# Patient Record
Sex: Male | Born: 1960 | Race: White | Hispanic: No | Marital: Married | Smoking: Never smoker
Health system: Southern US, Community
[De-identification: ages and names within clinical notes are randomized; demographics above are authoritative.]

## PROBLEM LIST (undated history)

## (undated) DIAGNOSIS — M1990 Unspecified osteoarthritis, unspecified site: Secondary | ICD-10-CM

## (undated) DIAGNOSIS — T8859XA Other complications of anesthesia, initial encounter: Secondary | ICD-10-CM

## (undated) DIAGNOSIS — T7840XA Allergy, unspecified, initial encounter: Secondary | ICD-10-CM

## (undated) DIAGNOSIS — T4145XA Adverse effect of unspecified anesthetic, initial encounter: Secondary | ICD-10-CM

## (undated) DIAGNOSIS — I10 Essential (primary) hypertension: Secondary | ICD-10-CM

## (undated) DIAGNOSIS — R42 Dizziness and giddiness: Secondary | ICD-10-CM

## (undated) HISTORY — DX: Essential (primary) hypertension: I10

## (undated) HISTORY — PX: APPENDECTOMY: SHX54

## (undated) HISTORY — DX: Allergy, unspecified, initial encounter: T78.40XA

## (undated) HISTORY — PX: ELBOW SURGERY: SHX618

## (undated) HISTORY — PX: HERNIA REPAIR: SHX51

## (undated) HISTORY — PX: JOINT REPLACEMENT: SHX530

---

## 2006-10-28 DIAGNOSIS — C4491 Basal cell carcinoma of skin, unspecified: Secondary | ICD-10-CM

## 2006-10-28 HISTORY — DX: Basal cell carcinoma of skin, unspecified: C44.91

## 2013-10-04 ENCOUNTER — Encounter: Payer: Self-pay | Admitting: *Deleted

## 2013-10-24 ENCOUNTER — Ambulatory Visit (INDEPENDENT_AMBULATORY_CARE_PROVIDER_SITE_OTHER): Payer: Self-pay | Admitting: General Surgery

## 2013-10-24 ENCOUNTER — Encounter: Payer: Self-pay | Admitting: General Surgery

## 2013-10-24 VITALS — BP 140/90 | HR 84 | Resp 12 | Ht 72.0 in | Wt 199.0 lb

## 2013-10-24 DIAGNOSIS — Z1211 Encounter for screening for malignant neoplasm of colon: Secondary | ICD-10-CM

## 2013-10-24 MED ORDER — POLYETHYLENE GLYCOL 3350 17 GM/SCOOP PO POWD: ORAL | Status: AC

## 2013-10-24 NOTE — Progress Notes (Signed)
Patient ID: Christian Hodges, male   DOB: 13-Feb-1961, 53 y.o.   MRN: [id number]  Chief Complaint  Patient presents with  . Other    colonoscopy    HPI Christian Hodges is a 53 y.o. male here today for a evaluation screening colonoscopy. Patient states GI problems at this time. No family history of colon problems. No prior colonoscopies.   HPI  Past Medical History  Diagnosis Date  . Hypertension   . Allergy     Past Surgical History  Procedure Laterality Date  . Joint replacement Bilateral     hip  . Appendectomy    . Hernia repair    . Elbow surgery Right     History reviewed. No pertinent family history.  Social History History  Substance Use Topics  . Smoking status: Never Smoker   . Smokeless tobacco: Never Used  . Alcohol Use: No    No Known Allergies  Current Outpatient Prescriptions  Medication Sig Dispense Refill  . lisinopril (PRINIVIL,ZESTRIL) 20 MG tablet Take 20 mg by mouth daily.      Marland Kitchen loratadine (CLARITIN) 10 MG tablet Take 10 mg by mouth daily.      . polyethylene glycol powder (GLYCOLAX/MIRALAX) powder 255 grams one bottle for colonoscopy prep  255 g  0   No current facility-administered medications for this visit.    Review of Systems Review of Systems  Constitutional: Negative.   Respiratory: Negative.   Cardiovascular: Negative.   Gastrointestinal: Negative.     Blood pressure 140/90, pulse 84, resp. rate 12, height 6' (1.829 m), weight 199 lb (90.266 kg).  Physical Exam Physical Exam  Constitutional: He appears well-developed and well-nourished.  Eyes: Conjunctivae are normal. No scleral icterus.  Neck: Neck supple. No thyromegaly present.  Cardiovascular: Normal rate, regular rhythm and normal heart sounds.   No murmur heard. Pulmonary/Chest: Effort normal and breath sounds normal.  Abdominal: Soft. Bowel sounds are normal. There is no hepatosplenomegaly. There is no tenderness. A hernia (small umbilical hernia present.) is present.   Lymphadenopathy:    He has no cervical adenopathy.    Data Reviewed  None  Assessment    Stable exam    Plan    Patient to be scheduled for screening colonoscopy. Risks and benefits discussed with patient. Patient agreeable.      Patient has been scheduled for a colonoscopy on 12-14-13 at [facility].  Kimiyo Carmicheal G 10/25/2013, 6:08 PM

## 2013-10-24 NOTE — Patient Instructions (Addendum)
Colonoscopy A colonoscopy is an exam to look at the entire large intestine (colon). This exam can help find problems such as tumors, polyps, inflammation, and areas of bleeding. The exam takes about 1 hour.  LET [facility] CARE PROVIDER KNOW ABOUT:   Any allergies you have.  All medicines you are taking, including vitamins, herbs, eye drops, creams, and over-the-counter medicines.  Previous problems you or members of your family have had with the use of anesthetics.  Any blood disorders you have.  Previous surgeries you have had.  Medical conditions you have. RISKS AND COMPLICATIONS  Generally, this is a safe procedure. However, as with any procedure, complications can occur. Possible complications include:  Bleeding.  Tearing or rupture of the colon wall.  Reaction to medicines given during the exam.  Infection (rare). BEFORE THE PROCEDURE   Ask your health care provider about changing or stopping your regular medicines.  You may be prescribed an oral bowel prep. This involves drinking a large amount of medicated liquid, starting the day before your procedure. The liquid will cause you to have multiple loose stools until your stool is almost clear or light green. This cleans out your colon in preparation for the procedure.  Do not eat or drink anything else once you have started the bowel prep, unless your health care provider tells you it is safe to do so.  Arrange for someone to drive you home after the procedure. PROCEDURE   You will be given medicine to help you relax (sedative).  You will lie on your side with your knees bent.  A long, flexible tube with a light and camera on the end (colonoscope) will be inserted through the rectum and into the colon. The camera sends video back to a computer screen as it moves through the colon. The colonoscope also releases carbon dioxide gas to inflate the colon. This helps your health care provider see the area better.  During  the exam, your health care provider may take a small tissue sample (biopsy) to be examined under a microscope if any abnormalities are found.  The exam is finished when the entire colon has been viewed. AFTER THE PROCEDURE   Do not drive for 24 hours after the exam.  You may have a small amount of blood in your stool.  You may pass moderate amounts of gas and have mild abdominal cramping or bloating. This is caused by the gas used to inflate your colon during the exam.  Ask when your test results will be ready and how you will get your results. Make sure you get your test results. Document Released: 03/21/2000 Document Revised: 01/12/2013 Document Reviewed: 11/29/2012 [facility] Patient Information 2015 [place], [place]. This information is not intended to replace advice given to you by your health care provider. Make sure you discuss any questions you have with your health care provider.  Patient has been scheduled for a colonoscopy on 12-14-13 at [facility].

## 2013-10-25 ENCOUNTER — Encounter: Payer: Self-pay | Admitting: General Surgery

## 2013-12-06 ENCOUNTER — Other Ambulatory Visit: Payer: Self-pay | Admitting: General Surgery

## 2013-12-06 DIAGNOSIS — Z1211 Encounter for screening for malignant neoplasm of colon: Secondary | ICD-10-CM

## 2013-12-14 ENCOUNTER — Ambulatory Visit: Payer: Self-pay | Admitting: General Surgery

## 2013-12-14 DIAGNOSIS — Z1211 Encounter for screening for malignant neoplasm of colon: Secondary | ICD-10-CM

## 2013-12-15 ENCOUNTER — Encounter: Payer: Self-pay | Admitting: General Surgery

## 2015-08-03 ENCOUNTER — Ambulatory Visit: Admit: 2015-08-03 | Payer: Self-pay | Admitting: Unknown Physician Specialty

## 2015-08-03 SURGERY — SEPTOPLASTY, NOSE
Anesthesia: General

## 2016-04-17 DIAGNOSIS — D239 Other benign neoplasm of skin, unspecified: Secondary | ICD-10-CM

## 2016-04-17 HISTORY — DX: Other benign neoplasm of skin, unspecified: D23.9

## 2016-07-18 NOTE — Discharge Instructions (Signed)
[facility] °[facility] °ENDOSCOPIC SINUS SURGERY °[facility] ° °What is Functional Endoscopic Sinus Surgery? ° The Surgery involves making the natural openings of the sinuses larger by removing the bony partitions that separate the sinuses from the nasal cavity.  The natural sinus lining is preserved as much as possible to allow the sinuses to resume normal function after the surgery.  In some patients nasal polyps (excessively swollen lining of the sinuses) may be removed to relieve obstruction of the sinus openings.  The surgery is performed through the nose using lighted scopes, which eliminates the need for incisions on the face.  A septoplasty is a different procedure which is sometimes performed with sinus surgery.  It involves straightening the boy partition that separates the two sides of your nose.  A crooked or deviated septum may need repair if is obstructing the sinuses or nasal airflow.  Turbinate reduction is also often performed during sinus surgery.  The turbinates are bony proturberances from the side walls of the nose which swell and can obstruct the nose in patients with sinus and allergy problems.  Their size can be surgically reduced to help relieve nasal obstruction. ° °What Can Sinus Surgery Do For Me? ° Sinus surgery can reduce the frequency of sinus infections requiring antibiotic treatment.  This can provide improvement in nasal congestion, post-nasal drainage, facial pressure and nasal obstruction.  Surgery will NOT prevent you from ever having an infection again, so it usually only for patients who get infections 4 or more times yearly requiring antibiotics, or for infections that do not clear with antibiotics.  It will not cure nasal allergies, so patients with allergies may still require medication to treat their allergies after surgery. Surgery may improve headaches related to sinusitis, however, some people will continue to  require medication to control sinus headaches related to allergies.  Surgery will do nothing for other forms of headache (migraine, tension or cluster). ° °What Are the Risks of Endoscopic Sinus Surgery? ° Current techniques allow surgery to be performed safely with little risk, however, there are rare complications that patients should be aware of.  Because the sinuses are located around the eyes, there is risk of eye injury, including blindness, though again, this would be quite rare. This is usually a result of bleeding behind the eye during surgery, which puts the vision oat risk, though there are treatments to protect the vision and prevent permanent disrupted by surgery causing a leak of the spinal fluid that surrounds the brain.  More serious complications would include bleeding inside the brain cavity or damage to the brain.  Again, all of these complications are uncommon, and spinal fluid leaks can be safely managed surgically if they occur.  The most common complication of sinus surgery is bleeding from the nose, which may require packing or cauterization of the nose.  Continued sinus have polyps may experience recurrence of the polyps requiring revision surgery.  Alterations of sense of smell or injury to the tear ducts are also rare complications.  ° °What is the Surgery Like, and what is the Recovery? ° The Surgery usually takes a couple of hours to perform, and is usually performed under a general anesthetic (completely asleep).  Patients are usually discharged home after a couple of hours.  Sometimes during surgery it is necessary to pack the nose to control bleeding, and the packing is left in place for 24 - 48 hours, and removed by your surgeon.    If a septoplasty was performed during the procedure, there is often a splint placed which must be removed after 5-7 days.   °Discomfort: Pain is usually mild to moderate, and can be controlled by prescription pain medication or acetaminophen (Tylenol).   Aspirin, Ibuprofen (Advil, Motrin), or Naprosyn (Aleve) should be avoided, as they can cause increased bleeding.  Most patients feel sinus pressure like they have a bad head cold for several days.  Sleeping with your head elevated can help reduce swelling and facial pressure, as can ice packs over the face.  A humidifier may be helpful to keep the mucous and blood from drying in the nose.  ° °Diet: There are no specific diet restrictions, however, you should generally start with clear liquids and a light diet of bland foods because the anesthetic can cause some nausea.  Advance your diet depending on how your stomach feels.  Taking your pain medication with food will often help reduce stomach upset which pain medications can cause. ° °Nasal Saline Irrigation: It is important to remove blood clots and dried mucous from the nose as it is healing.  This is done by having you irrigate the nose at least 3 - 4 times daily with a salt water solution.  We recommend using NeilMed Sinus Rinse (available at the drug store).  Fill the squeeze bottle with the solution, bend over a sink, and insert the tip of the squeeze bottle into the nose ½ of an inch.  Point the tip of the squeeze bottle towards the inside corner of the eye on the same side your irrigating.  Squeeze the bottle and gently irrigate the nose.  If you bend forward as you do this, most of the fluid will flow back out of the nose, instead of down your throat.   The solution should be warm, near body temperature, when you irrigate.   Each time you irrigate, you should use a full squeeze bottle.  ° °Note that if you are instructed to use Nasal Steroid Sprays at any time after your surgery, irrigate with saline BEFORE using the steroid spray, so you do not wash it all out of the nose. °Another product, Nasal Saline Gel (such as AYR Nasal Saline Gel) can be applied in each nostril 3 - 4 times daily to moisture the nose and reduce scabbing or crusting. ° °Bleeding:   Bloody drainage from the nose can be expected for several days, and patients are instructed to irrigate their nose frequently with salt water to help remove mucous and blood clots.  The drainage may be dark red or brown, though some fresh blood may be seen intermittently, especially after irrigation.  Do not blow you nose, as bleeding may occur. If you must sneeze, keep your mouth open to allow air to escape through your mouth. ° °If heavy bleeding occurs: Irrigate the nose with saline to rinse out clots, then spray the nose 3 - 4 times with Afrin Nasal Decongestant Spray.  The spray will constrict the blood vessels to slow bleeding.  Pinch the lower half of your nose shut to apply pressure, and lay down with your head elevated.  Ice packs over the nose may help as well. If bleeding persists despite these measures, you should notify your doctor.  Do not use the Afrin routinely to control nasal congestion after surgery, as it can result in worsening congestion and may affect healing.  ° °Activity: Return to work varies among patients. Most patients will be out of   work at least 5 - 7 days to recover.  Patient may return to work after they are off of narcotic pain medication, and feeling well enough to perform the functions of their job.  Patients must avoid heavy lifting (over 10 pounds) or strenuous physical for 2 weeks after surgery, so your employer may need to assign you to light duty, or keep you out of work longer if light duty is not possible.  NOTE: you should not drive, operate dangerous machinery, do any mentally demanding tasks or make any important legal or financial decisions while on narcotic pain medication and recovering from the general anesthetic.  °  °Call Your Doctor Immediately if You Have Any of the Following: °1. Bleeding that you cannot control with the above measures °2. Loss of vision, double vision, bulging of the eye or black eyes. °3. Fever over 101 degrees °4. Neck stiffness with severe  headache, fever, nausea and change in mental state. °You are always encourage to call anytime with concerns, however, please call with requests for pain medication refills during office hours. ° °Office Endoscopy: During follow-up visits your doctor will remove any packing or splints that may have been placed and evaluate and clean your sinuses endoscopically.  Topical anesthetic will be used to make this as comfortable as possible, though you may want to take your pain medication prior to the visit.  How often this will need to be done varies from patient to patient.  After complete recovery from the surgery, you may need follow-up endoscopy from time to time, particularly if there is concern of recurrent infection or nasal polyps. ° ° °General Anesthesia, Adult, Care After °These instructions provide you with information about caring for yourself after your procedure. Your health care provider may also give you more specific instructions. Your treatment has been planned according to current medical practices, but problems sometimes occur. Call your health care provider if you have any problems or questions after your procedure. °What can I expect after the procedure? °After the procedure, it is common to have: °· Vomiting. °· A sore throat. °· Mental slowness. °It is common to feel: °· Nauseous. °· Cold or shivery. °· Sleepy. °· Tired. °· Sore or achy, even in parts of your body where you did not have surgery. °Follow these instructions at home: °For at least 24 hours after the procedure: °· Do not: °¨ Participate in activities where you could fall or become injured. °¨ Drive. °¨ Use heavy machinery. °¨ Drink alcohol. °¨ Take sleeping pills or medicines that cause drowsiness. °¨ Make important decisions or sign legal documents. °¨ Take care of children on your own. °· Rest. °Eating and drinking °· If you vomit, drink water, juice, or soup when you can drink without vomiting. °· Drink enough fluid to keep your  urine clear or pale yellow. °· Make sure you have little or no nausea before eating solid foods. °· Follow the diet recommended by your health care provider. °General instructions °· Have a responsible adult stay with you until you are awake and alert. °· Return to your normal activities as told by your health care provider. Ask your health care provider what activities are safe for you. °· Take over-the-counter and prescription medicines only as told by your health care provider. °· If you smoke, do not smoke without supervision. °· Keep all follow-up visits as told by your health care provider. This is important. °Contact a health care provider if: °· You continue to have nausea   or vomiting at home, and medicines are not helpful. °· You cannot drink fluids or start eating again. °· You cannot urinate after 8-12 hours. °· You develop a skin rash. °· You have fever. °· You have increasing redness at the site of your procedure. °Get help right away if: °· You have difficulty breathing. °· You have chest pain. °· You have unexpected bleeding. °· You feel that you are having a life-threatening or urgent problem. °This information is not intended to replace advice given to you by your health care provider. Make sure you discuss any questions you have with your health care provider. °Document Released: 06/30/2000 Document Revised: 08/27/2015 Document Reviewed: 03/08/2015 °Elsevier Interactive Patient Education © 2017 [employer] ° °

## 2016-07-21 ENCOUNTER — Encounter: Payer: Self-pay | Admitting: *Deleted

## 2016-07-22 ENCOUNTER — Encounter: Payer: Self-pay | Admitting: Anesthesiology

## 2016-07-25 ENCOUNTER — Ambulatory Visit: Payer: 59 | Admitting: Anesthesiology

## 2016-07-25 ENCOUNTER — Ambulatory Visit
Admission: RE | Admit: 2016-07-25 | Discharge: 2016-07-25 | Disposition: A | Discharge status: Home / Self Care | Payer: 59 | Source: Ambulatory Visit | Attending: Unknown Physician Specialty | Admitting: Unknown Physician Specialty

## 2016-07-25 ENCOUNTER — Encounter
Admission: RE | Disposition: A | Discharge status: Home / Self Care | Payer: Self-pay | Source: Ambulatory Visit | Attending: Unknown Physician Specialty

## 2016-07-25 DIAGNOSIS — J342 Deviated nasal septum: Secondary | ICD-10-CM | POA: Insufficient documentation

## 2016-07-25 DIAGNOSIS — Z96649 Presence of unspecified artificial hip joint: Secondary | ICD-10-CM | POA: Insufficient documentation

## 2016-07-25 DIAGNOSIS — I10 Essential (primary) hypertension: Secondary | ICD-10-CM | POA: Diagnosis not present

## 2016-07-25 DIAGNOSIS — J343 Hypertrophy of nasal turbinates: Secondary | ICD-10-CM | POA: Insufficient documentation

## 2016-07-25 DIAGNOSIS — J3489 Other specified disorders of nose and nasal sinuses: Secondary | ICD-10-CM | POA: Insufficient documentation

## 2016-07-25 HISTORY — PX: SEPTOPLASTY: SHX2393

## 2016-07-25 HISTORY — DX: Other complications of anesthesia, initial encounter: T88.59XA

## 2016-07-25 HISTORY — DX: Unspecified osteoarthritis, unspecified site: M19.90

## 2016-07-25 HISTORY — PX: NASAL TURBINATE REDUCTION: SHX2072

## 2016-07-25 HISTORY — DX: Dizziness and giddiness: R42

## 2016-07-25 HISTORY — DX: Adverse effect of unspecified anesthetic, initial encounter: T41.45XA

## 2016-07-25 SURGERY — SEPTOPLASTY, NOSE
Anesthesia: General | Site: Nose | Wound class: Clean Contaminated

## 2016-07-25 MED ORDER — OXYCODONE HCL 5 MG PO TABS
5.0000 mg | ORAL_TABLET | Freq: Once | ORAL | Status: AC | PRN
  Administered 2016-07-25: 5 mg via ORAL

## 2016-07-25 MED ORDER — EPHEDRINE SULFATE 50 MG/ML IJ SOLN
INTRAMUSCULAR | Status: DC | PRN
  Administered 2016-07-25: 5 mg via INTRAVENOUS

## 2016-07-25 MED ORDER — DEXAMETHASONE SODIUM PHOSPHATE 4 MG/ML IJ SOLN
INTRAMUSCULAR | Status: DC | PRN
  Administered 2016-07-25: 10 mg via INTRAVENOUS

## 2016-07-25 MED ORDER — CEFAZOLIN IN D5W 1 GM/50ML IV SOLN
1.0000 g | Freq: Once | INTRAVENOUS | Status: AC
  Administered 2016-07-25: 1 g via INTRAVENOUS

## 2016-07-25 MED ORDER — PHENYLEPHRINE HCL 0.5 % NA SOLN
NASAL | Status: DC | PRN
  Administered 2016-07-25: 30 mL via TOPICAL

## 2016-07-25 MED ORDER — LACTATED RINGERS IV SOLN
10.0000 mL/h | INTRAVENOUS | Status: DC
  Administered 2016-07-25 (×2): 10 mL/h via INTRAVENOUS

## 2016-07-25 MED ORDER — PROPOFOL 10 MG/ML IV BOLUS
INTRAVENOUS | Status: DC | PRN
  Administered 2016-07-25: 200 mg via INTRAVENOUS

## 2016-07-25 MED ORDER — ONDANSETRON HCL 4 MG/2ML IJ SOLN
INTRAMUSCULAR | Status: DC | PRN
  Administered 2016-07-25: 4 mg via INTRAVENOUS

## 2016-07-25 MED ORDER — FENTANYL CITRATE (PF) 100 MCG/2ML IJ SOLN: 25.0000 ug | INTRAMUSCULAR | Status: DC | PRN

## 2016-07-25 MED ORDER — LIDOCAINE HCL (CARDIAC) 20 MG/ML IV SOLN
INTRAVENOUS | Status: DC | PRN
  Administered 2016-07-25: 50 mg via INTRAVENOUS

## 2016-07-25 MED ORDER — SULFAMETHOXAZOLE-TRIMETHOPRIM 800-160 MG PO TABS: 1.0000 | ORAL_TABLET | Freq: Two times a day (BID) | ORAL | 0 refills | Status: AC

## 2016-07-25 MED ORDER — OXYMETAZOLINE HCL 0.05 % NA SOLN
6.0000 | Freq: Once | NASAL | Status: AC
  Administered 2016-07-25: 6 via NASAL

## 2016-07-25 MED ORDER — ACETAMINOPHEN 10 MG/ML IV SOLN
1000.0000 mg | Freq: Once | INTRAVENOUS | Status: AC
  Administered 2016-07-25: 1000 mg via INTRAVENOUS

## 2016-07-25 MED ORDER — ONDANSETRON HCL 4 MG/2ML IJ SOLN
4.0000 mg | Freq: Once | INTRAMUSCULAR | Status: AC
  Administered 2016-07-25: 4 mg via INTRAVENOUS

## 2016-07-25 MED ORDER — ACETAMINOPHEN 160 MG/5ML PO SOLN: 325.0000 mg | ORAL | Status: DC | PRN

## 2016-07-25 MED ORDER — GLYCOPYRROLATE 0.2 MG/ML IJ SOLN
0.2000 mg | Freq: Once | INTRAMUSCULAR | Status: AC
  Administered 2016-07-25: 0.2 mg via INTRAVENOUS

## 2016-07-25 MED ORDER — SUCCINYLCHOLINE CHLORIDE 20 MG/ML IJ SOLN
INTRAMUSCULAR | Status: DC | PRN
  Administered 2016-07-25: 100 mg via INTRAVENOUS

## 2016-07-25 MED ORDER — ACETAMINOPHEN 325 MG PO TABS: 325.0000 mg | ORAL_TABLET | ORAL | Status: DC | PRN

## 2016-07-25 MED ORDER — LIDOCAINE-EPINEPHRINE 1 %-1:100000 IJ SOLN
INTRAMUSCULAR | Status: DC | PRN
  Administered 2016-07-25: 12 mL

## 2016-07-25 MED ORDER — FENTANYL CITRATE (PF) 100 MCG/2ML IJ SOLN
INTRAMUSCULAR | Status: DC | PRN
  Administered 2016-07-25: 50 ug via INTRAVENOUS

## 2016-07-25 MED ORDER — ONDANSETRON HCL 4 MG/2ML IJ SOLN: 4.0000 mg | Freq: Once | INTRAMUSCULAR | Status: DC

## 2016-07-25 MED ORDER — MIDAZOLAM HCL 5 MG/5ML IJ SOLN
INTRAMUSCULAR | Status: DC | PRN
  Administered 2016-07-25: 2 mg via INTRAVENOUS

## 2016-07-25 MED ORDER — OXYCODONE-ACETAMINOPHEN 5-325 MG PO TABS: 1.0000 | ORAL_TABLET | ORAL | 0 refills | Status: AC | PRN

## 2016-07-25 MED ORDER — BACITRACIN-NEOMYCIN-POLYMYXIN 400-5-5000 EX OINT
TOPICAL_OINTMENT | CUTANEOUS | Status: DC | PRN
  Administered 2016-07-25: 1 via TOPICAL

## 2016-07-25 MED ORDER — GLYCOPYRROLATE 0.2 MG/ML IJ SOLN
INTRAMUSCULAR | Status: DC | PRN
  Administered 2016-07-25: 0.1 mg via INTRAVENOUS

## 2016-07-25 MED ORDER — PROMETHAZINE HCL 25 MG/ML IJ SOLN: 6.2500 mg | INTRAMUSCULAR | Status: DC | PRN

## 2016-07-25 MED ORDER — OXYCODONE HCL 5 MG/5ML PO SOLN: 5.0000 mg | Freq: Once | ORAL | Status: AC | PRN

## 2016-07-25 SURGICAL SUPPLY — 30 items
BLADE SURG 15 STRL LF DISP TIS (BLADE) IMPLANT
BLADE SURG 15 STRL SS (BLADE)
COAG SUCT 10F 3.5MM HAND CTRL (MISCELLANEOUS) ×3 IMPLANT
DRAPE HEAD BAR (DRAPES) ×3 IMPLANT
DRESSING NASL FOAM PST OP SINU (MISCELLANEOUS) ×4 IMPLANT
DRSG NASAL FOAM POST OP SINU (MISCELLANEOUS) ×6
GLOVE BIO SURGEON STRL SZ7.5 (GLOVE) ×6 IMPLANT
HANDLE YANKAUER SUCT BULB TIP (MISCELLANEOUS) ×3 IMPLANT
KIT ROOM TURNOVER OR (KITS) ×3 IMPLANT
NEEDLE HYPO 25GX1X1/2 BEV (NEEDLE) ×3 IMPLANT
PACK DRAPE NASAL/ENT (PACKS) ×3 IMPLANT
PAD GROUND ADULT SPLIT (MISCELLANEOUS) ×3 IMPLANT
SOL ANTI-FOG 6CC FOG-OUT (MISCELLANEOUS) ×2 IMPLANT
SOL FOG-OUT ANTI-FOG 6CC (MISCELLANEOUS) ×1
SPLINT NASAL SEPTAL BLV .25 LG (MISCELLANEOUS) ×3 IMPLANT
SPLINT NASAL SEPTAL BLV .50 ST (MISCELLANEOUS) IMPLANT
SPONGE NEURO XRAY DETECT 1X3 (DISPOSABLE) ×3 IMPLANT
STRAP BODY AND KNEE 60X3 (MISCELLANEOUS) ×3 IMPLANT
SUT CHROMIC 3-0 (SUTURE) ×1
SUT CHROMIC 3-0 KS 27XMFL CR (SUTURE) ×2
SUT CHROMIC 5-0 (SUTURE)
SUT CHROMIC 5-0 P2 18XMFL CR (SUTURE)
SUT ETHILON 3-0 KS 30 BLK (SUTURE) ×3 IMPLANT
SUT PLAIN GUT 4-0 (SUTURE) IMPLANT
SUTURE CHRMC 3-0 KS 27XMFL CR (SUTURE) ×2 IMPLANT
SUTURE CHRMC 5-0 P2 18XMF CR (SUTURE) IMPLANT
SYRINGE 10CC LL (SYRINGE) ×3 IMPLANT
TOWEL OR 17X26 4PK STRL BLUE (TOWEL DISPOSABLE) ×3 IMPLANT
WATER STERILE IRR 250ML POUR (IV SOLUTION) ×3 IMPLANT
WATER STERILE IRR 500ML POUR (IV SOLUTION) ×3 IMPLANT

## 2016-07-25 NOTE — Anesthesia Postprocedure Evaluation (Signed)
Anesthesia Post Note  Patient: Christian Hodges  Procedure(s) Performed: Procedure(s) (LRB): SEPTOPLASTY (N/A) SUBMUCOSAL RESECTION turbinates (Bilateral)  Patient location during evaluation: PACU Anesthesia Type: General Level of consciousness: awake and alert Pain management: pain level controlled Vital Signs Assessment: post-procedure vital signs reviewed and stable Respiratory status: spontaneous breathing, nonlabored ventilation and respiratory function stable Cardiovascular status: stable Postop Assessment: no signs of nausea or vomiting Anesthetic complications: no    Veda Canning

## 2016-07-25 NOTE — Op Note (Signed)
PREOPERATIVE DIAGNOSIS:  Chronic nasal obstruction.  POSTOPERATIVE DIAGNOSIS:  Chronic nasal obstruction.  SURGEON:  Roena Malady, M.D.  NAME OF PROCEDURE:  1. Nasal septoplasty. 2. Submucous resection of inferior turbinates.  OPERATIVE FINDINGS:  Severe nasal septal deformity, hypertrophy of the inferior turbinates.   DESCRIPTION OF THE PROCEDURE:  KALETH KOY was identified in the holding area and taken to the operating room and placed in the supine position.  After general endotracheal anesthesia was induced, the table was turned 45 degrees and the patient was placed in a semi-Fowler position.  The nose was then topically anesthetized with Lidocaine, cotton pledgets were placed within each nostril. After approximately 5 minutes, this was removed at which time a local anesthetic of 1% Lidocaine 1:100,000 units of Epinephrine was used to inject the inferior turbinates in the nasal septum. A total of 12 ml was used. Examination of the nose showed a severe left nasal septal deformity and tremendous hypertrophied inferior turbinate.  Beginning on the right hand side a hemitransfixion incision was then created on the leading edge of the septum on the right.  A subperichondrial plane was elevated posteriorly on the left and taken back to the perpendicular plate of the ethmoid where subperiosteal plane was elevated posteriorly on the left. A large septal spur was identified on the left hand side impacting on the inferior turbinate.  An inferior rim of cartilage was removed anteriorly with care taken to leave an anterior strut to prevent nasal collapse. With this strut removed the perpendicular plate of the ethmoid was separated from the quadrangular cartilage. The large septal spur was removed.  The septum was then replaced in the midline. Reinspection through each nostril showed excellent reduction of the septal deformity. A left posterior inferior fenestration was then created to allow hematoma  drainage.  With the septoplasty completed, beginning on the left-hand side, a 15 blade was used to incise along the inferior edge of the inferior turbinate. A superior laterally based flap was then elevated. The underlying conchal bone of mucosa was excised using Knight scissors. The flap was then laid back over the turbinate stump and cauterized using suction cautery. In a similar fashion the submucous resection was performed on the right.  With the submucous resection completed bilaterally and no active bleeding, the hemitransfixion incision was then closed using two interrupted 3-0 chromic sutures.  Plastic nasal septal splints were placed within each nostril and affixed to the septum using a 3-0 nylon suture. Stammberger was then used beneath each inferior turbinate for hemostasis.    The patient tolerated the procedure well, was returned to anesthesia, extubated in the operating room, and taken to the recovery room in stable condition.    CULTURES:  None.  SPECIMENS:  None.  ESTIMATED BLOOD LOSS:  25 cc.  Eber Ferrufino T  07/25/2016  9:11 AM

## 2016-07-25 NOTE — Addendum Note (Signed)
Addendum  created 07/25/16 1050 by Veda Canning, MD   Sign clinical note

## 2016-07-25 NOTE — Transfer of Care (Signed)
Immediate Anesthesia Transfer of Care Note  Patient: Christian Hodges  Procedure(s) Performed: Procedure(s): SEPTOPLASTY (N/A) SUBMUCOSAL RESECTION turbinates (Bilateral)  Patient Location: PACU  Anesthesia Type: General  Level of Consciousness: awake, alert  and patient cooperative  Airway and Oxygen Therapy: Patient Spontanous Breathing and Patient connected to supplemental oxygen  Post-op Assessment: Post-op Vital signs reviewed, Patient's Cardiovascular Status Stable, Respiratory Function Stable, Patent Airway and No signs of Nausea or vomiting  Post-op Vital Signs: Reviewed and stable  Complications: No apparent anesthesia complications

## 2016-07-25 NOTE — Anesthesia Preprocedure Evaluation (Addendum)
Anesthesia Evaluation  Patient identified by MRN, date of birth, ID band Patient awake    Reviewed: Allergy & Precautions, H&P , NPO status   Airway Mallampati: II  TM Distance: >3 FB     Dental  (+) Teeth Intact   Pulmonary neg pulmonary ROS,    breath sounds clear to auscultation       Cardiovascular hypertension,  Rhythm:Regular Rate:Normal     Neuro/Psych    GI/Hepatic   Endo/Other    Renal/GU      Musculoskeletal  (+) Arthritis ,   Abdominal   Peds  Hematology   Anesthesia Other Findings   Reproductive/Obstetrics                          Anesthesia Physical Anesthesia Plan  ASA: II  Anesthesia Plan: MAC   Post-op Pain Management:    Induction:   Airway Management Planned: Nasal Cannula  Additional Equipment:   Intra-op Plan:   Post-operative Plan:   Informed Consent: I have reviewed the patients History and Physical, chart, labs and discussed the procedure including the risks, benefits and alternatives for the proposed anesthesia with the patient or authorized representative who has indicated his/her understanding and acceptance.     Plan Discussed with: CRNA  Anesthesia Plan Comments:        Anesthesia Quick Evaluation

## 2016-07-25 NOTE — H&P (Signed)
The patient's history has been reviewed, patient examined, no change in status, stable for surgery.  Questions were answered to the patients satisfaction.  

## 2016-07-25 NOTE — Anesthesia Procedure Notes (Signed)
Procedure Name: Intubation Date/Time: 07/25/2016 8:28 AM Performed by: Mayme Genta Pre-anesthesia Checklist: Patient identified, Emergency Drugs available, Suction available, Patient being monitored and Timeout performed Patient Re-evaluated:Patient Re-evaluated prior to inductionOxygen Delivery Method: Circle system utilized Preoxygenation: Pre-oxygenation with 100% oxygen Intubation Type: IV induction Ventilation: Mask ventilation without difficulty Laryngoscope Size: Miller and 3 Grade View: Grade I Tube type: Oral Rae Tube size: 7.5 mm Number of attempts: 1 Placement Confirmation: ETT inserted through vocal cords under direct vision,  positive ETCO2 and breath sounds checked- equal and bilateral Tube secured with: Tape Dental Injury: Teeth and Oropharynx as per pre-operative assessment

## 2016-07-28 ENCOUNTER — Encounter: Payer: Self-pay | Admitting: Unknown Physician Specialty

## 2016-10-27 ENCOUNTER — Other Ambulatory Visit: Payer: Self-pay | Admitting: Internal Medicine

## 2016-10-27 ENCOUNTER — Ambulatory Visit
Admission: RE | Admit: 2016-10-27 | Discharge: 2016-10-27 | Disposition: A | Discharge status: Home / Self Care | Payer: 59 | Source: Ambulatory Visit | Attending: Internal Medicine | Admitting: Internal Medicine

## 2016-10-27 DIAGNOSIS — R05 Cough: Secondary | ICD-10-CM | POA: Diagnosis present

## 2016-10-27 DIAGNOSIS — R059 Cough, unspecified: Secondary | ICD-10-CM

## 2016-10-27 DIAGNOSIS — R918 Other nonspecific abnormal finding of lung field: Secondary | ICD-10-CM | POA: Insufficient documentation

## 2018-03-27 IMAGING — DX DG CHEST 2V
2 series · 2 of 2 positions shown · non-contrast
Comparison: No prior .

CLINICAL DATA: Cough .

EXAM:
CHEST  2 VIEW

[chest pa]
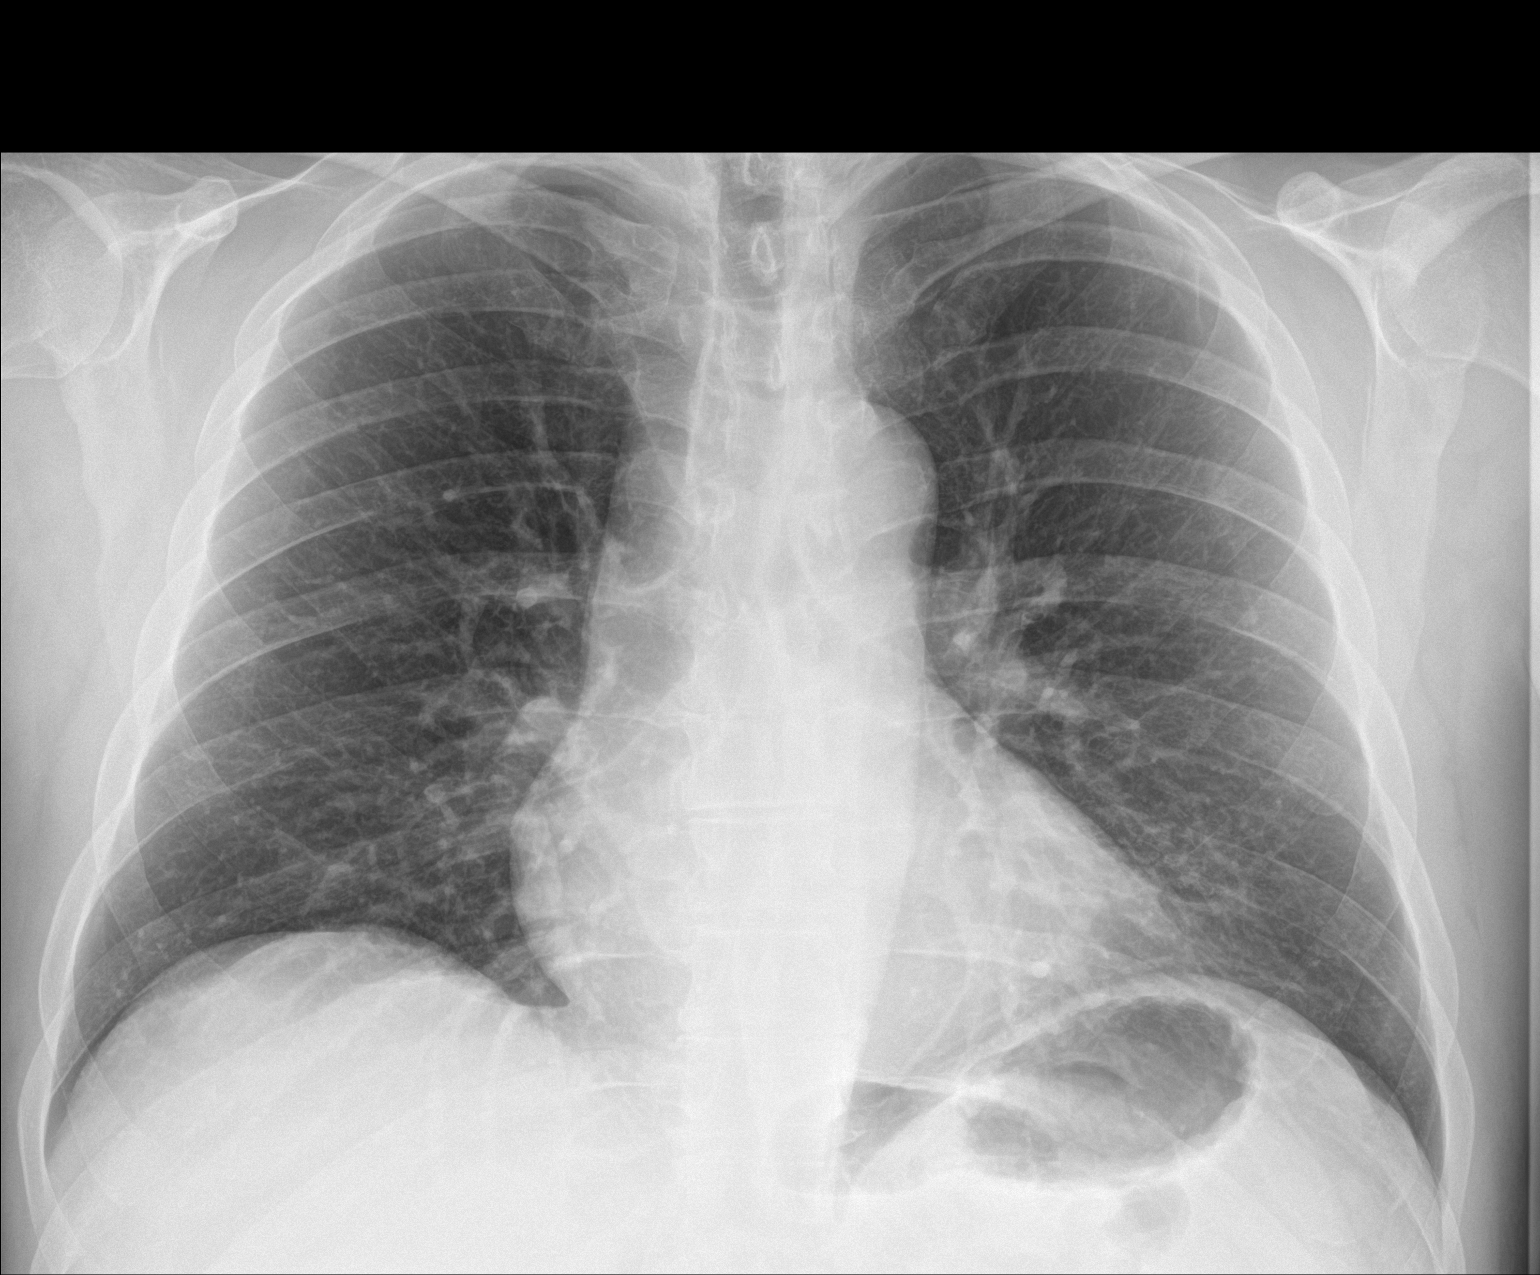

[chest lat]
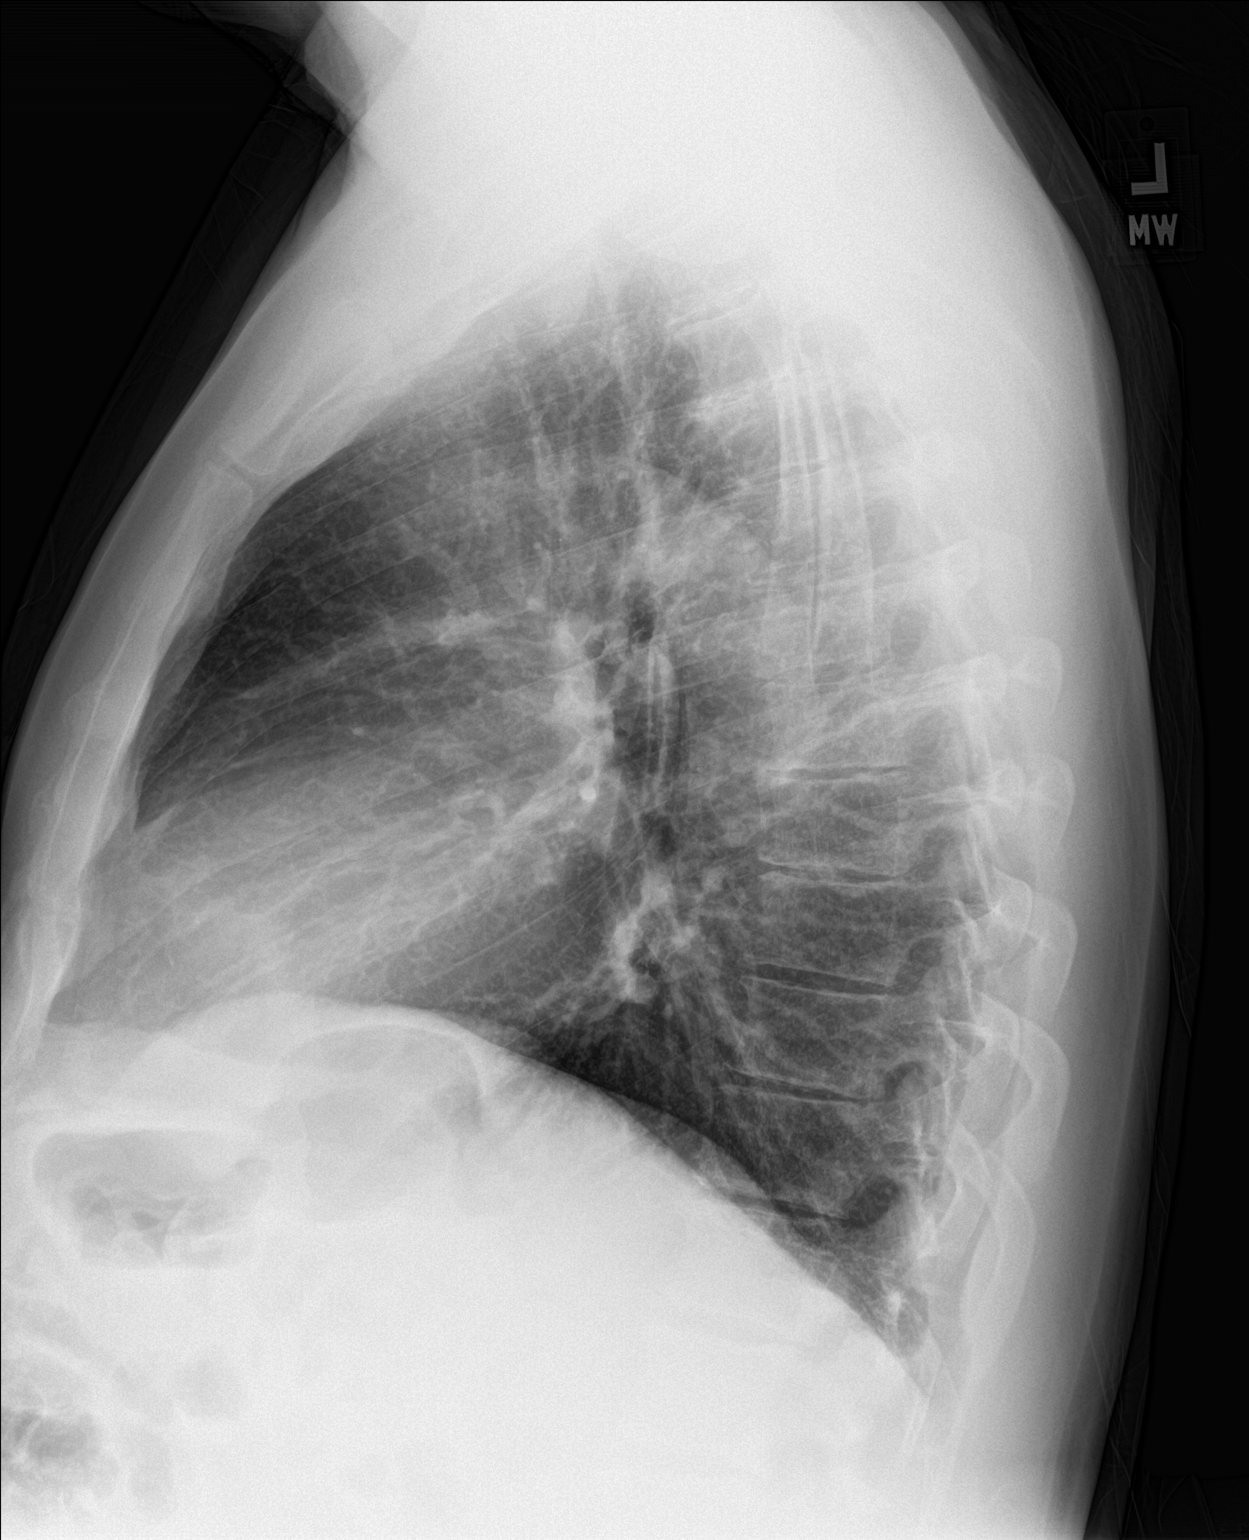

[2 of 2 positions shown; findings below may reference images not displayed]

FINDINGS: Mediastinum and hilar structures normal. Cardiomegaly with normal
pulmonary vascularity. Bilateral pulmonary nodular densities are
noted most likely calcified, consistent with granulomas .
Nonenhanced chest CT suggested to exclude significant noncalcified
pulmonary nodule . Mild lingular infiltrate cannot be excluded. No
pleural effusion or pneumothorax.
IMPRESSION: 1.  Mild lingular infiltrate cannot be excluded.

2. Small bilateral pulmonary nodules most likely calcified. These
are most likely granulomas. Nonenhanced chest CT suggested to
exclude significant noncalcified pulmonary nodule.

## 2019-05-12 ENCOUNTER — Ambulatory Visit: Payer: 59 | Attending: Internal Medicine

## 2019-05-12 DIAGNOSIS — Z20822 Contact with and (suspected) exposure to covid-19: Secondary | ICD-10-CM

## 2019-05-13 ENCOUNTER — Ambulatory Visit: Payer: 59 | Attending: Internal Medicine

## 2019-05-13 LAB — NOVEL CORONAVIRUS, NAA: SARS-CoV-2, NAA: NOT DETECTED

## 2022-02-03 ENCOUNTER — Ambulatory Visit (INDEPENDENT_AMBULATORY_CARE_PROVIDER_SITE_OTHER): Payer: BC Managed Care – PPO | Admitting: Dermatology

## 2022-02-03 ENCOUNTER — Encounter: Payer: Self-pay | Admitting: Dermatology

## 2022-02-03 DIAGNOSIS — Z1283 Encounter for screening for malignant neoplasm of skin: Secondary | ICD-10-CM | POA: Diagnosis not present

## 2022-02-03 DIAGNOSIS — D229 Melanocytic nevi, unspecified: Secondary | ICD-10-CM

## 2022-02-03 DIAGNOSIS — L814 Other melanin hyperpigmentation: Secondary | ICD-10-CM

## 2022-02-03 DIAGNOSIS — L578 Other skin changes due to chronic exposure to nonionizing radiation: Secondary | ICD-10-CM | POA: Diagnosis not present

## 2022-02-03 DIAGNOSIS — L821 Other seborrheic keratosis: Secondary | ICD-10-CM

## 2022-02-03 DIAGNOSIS — L820 Inflamed seborrheic keratosis: Secondary | ICD-10-CM | POA: Diagnosis not present

## 2022-02-03 DIAGNOSIS — Z86018 Personal history of other benign neoplasm: Secondary | ICD-10-CM

## 2022-02-03 DIAGNOSIS — Z85828 Personal history of other malignant neoplasm of skin: Secondary | ICD-10-CM | POA: Diagnosis not present

## 2022-02-03 NOTE — Progress Notes (Unsigned)
New Patient Visit  Subjective  Christian Hodges is a 61 y.o. male who presents for the following: Annual Exam (Hx BCC, dysplastic nevus). The patient presents for Total-Body Skin Exam (TBSE) for skin cancer screening and mole check.  The patient has spots, moles and lesions to be evaluated, some may be new or changing.  The following portions of the chart were reviewed this encounter and updated as appropriate:   Tobacco  Allergies  Meds  Problems  Med Hx  Surg Hx  Fam Hx     Review of Systems:  No other skin or systemic complaints except as noted in HPI or Assessment and Plan.  Objective  Well appearing patient in no apparent distress; mood and affect are within normal limits.  A full examination was performed including scalp, head, eyes, ears, nose, lips, neck, chest, axillae, abdomen, back, buttocks, bilateral upper extremities, bilateral lower extremities, hands, feet, fingers, toes, fingernails, and toenails. All findings within normal limits unless otherwise noted below.  L post shoulder x 1 Erythematous stuck-on, waxy papule or plaque   Assessment & Plan  Inflamed seborrheic keratosis L post shoulder x 1 Symptomatic, irritating, patient would like treated. Destruction of lesion - L post shoulder x 1 Complexity: simple   Destruction method: cryotherapy   Informed consent: discussed and consent obtained   Timeout:  patient name, date of birth, surgical site, and procedure verified Lesion destroyed using liquid nitrogen: Yes   Region frozen until ice ball extended beyond lesion: Yes   Outcome: patient tolerated procedure well with no complications   Post-procedure details: wound care instructions given    Lentigines - Scattered tan macules - Due to sun exposure - Benign-appearing, observe - Recommend daily broad spectrum sunscreen SPF 30+ to sun-exposed areas, reapply every 2 hours as needed. - Call for any changes  Seborrheic Keratoses - Stuck-on, waxy,  tan-brown papules and/or plaques  - Benign-appearing - Discussed benign etiology and prognosis. - Observe - Call for any changes  Melanocytic Nevi - Tan-brown and/or pink-flesh-colored symmetric macules and papules - Benign appearing on exam today - Observation - Call clinic for new or changing moles - Recommend daily use of broad spectrum spf 30+ sunscreen to sun-exposed areas.   Hemangiomas - Red papules - Discussed benign nature - Observe - Call for any changes  Actinic Damage - Chronic condition, secondary to cumulative UV/sun exposure - diffuse scaly erythematous macules with underlying dyspigmentation - Recommend daily broad spectrum sunscreen SPF 30+ to sun-exposed areas, reapply every 2 hours as needed.  - Staying in the shade or wearing long sleeves, sun glasses (UVA+UVB protection) and wide brim hats (4-inch brim around the entire circumference of the hat) are also recommended for sun protection.  - Call for new or changing lesions.  History of Basal Cell Carcinoma of the Skin - No evidence of recurrence today - Recommend regular full body skin exams - Recommend daily broad spectrum sunscreen SPF 30+ to sun-exposed areas, reapply every 2 hours as needed.  - Call if any new or changing lesions are noted between office visits  History of Dysplastic Nevus - No evidence of recurrence today - Recommend regular full body skin exams - Recommend daily broad spectrum sunscreen SPF 30+ to sun-exposed areas, reapply every 2 hours as needed.  - Call if any new or changing lesions are noted between office visits  Skin cancer screening performed today.  Return in about 1 year (around 02/04/2023) for TBSE.  Luther Redo, CMA, am  acting as scribe for Sarina Ser, MD . Documentation: I have reviewed the above documentation for accuracy and completeness, and I agree with the above.  Sarina Ser, MD

## 2022-02-03 NOTE — Patient Instructions (Signed)
Due to recent changes in healthcare laws, you may see results of your pathology and/or laboratory studies on MyChart before the doctors have had a chance to review them. We understand that in some cases there may be results that are confusing or concerning to you. Please understand that not all results are received at the same time and often the doctors may need to interpret multiple results in order to provide you with the best plan of care or course of treatment. Therefore, we ask that you please give us 2 business days to thoroughly review all your results before contacting the office for clarification. Should we see a critical lab result, you will be contacted sooner.   If You Need Anything After Your Visit  If you have any questions or concerns for your doctor, please call our main line at [phone] and press option 4 to reach your doctor's medical assistant. If no one answers, please leave a voicemail as directed and we will return your call as soon as possible. Messages left after 4 pm will be answered the following business day.   You may also send us a message via MyChart. We typically respond to MyChart messages within 1-2 business days.  For prescription refills, please ask your pharmacy to contact our office. Our fax number is [phone].  If you have an urgent issue when the clinic is closed that cannot wait until the next business day, you can page your doctor at the number below.    Please note that while we do our best to be available for urgent issues outside of office hours, we are not available 24/7.   If you have an urgent issue and are unable to reach us, you may choose to seek medical care at your doctor's office, retail clinic, urgent care center, or emergency room.  If you have a medical emergency, please immediately call 911 or go to the emergency department.  Pager Numbers  - Dr. Kowalski: [phone]  - Dr. Moye: [phone]  - Dr. Stewart:  [phone]  In the event of inclement weather, please call our main line at [phone] for an update on the status of any delays or closures.  Dermatology Medication Tips: Please keep the boxes that topical medications come in in order to help keep track of the instructions about where and how to use these. Pharmacies typically print the medication instructions only on the boxes and not directly on the medication tubes.   If your medication is too expensive, please contact our office at [phone] option 4 or send us a message through MyChart.   We are unable to tell what your co-pay for medications will be in advance as this is different depending on your insurance coverage. However, we may be able to find a substitute medication at lower cost or fill out paperwork to get insurance to cover a needed medication.   If a prior authorization is required to get your medication covered by your insurance company, please allow us 1-2 business days to complete this process.  Drug prices often vary depending on where the prescription is filled and some pharmacies may offer cheaper prices.  The website www.goodrx.com contains coupons for medications through different pharmacies. The prices here do not account for what the cost may be with help from insurance (it may be cheaper with your insurance), but the website can give you the price if you did not use any insurance.  - You can print the associated coupon and take it with   your prescription to the pharmacy.  - You may also stop by our office during regular business hours and pick up a GoodRx coupon card.  - If you need your prescription sent electronically to a different pharmacy, notify our office through [facility] MyChart or by phone at [phone] option 4.     Si Usted Necesita Algo Despus de Su Visita  Tambin puede enviarnos un mensaje a travs de MyChart. Por lo general respondemos a los mensajes de MyChart en el transcurso de 1 a 2  das hbiles.  Para renovar recetas, por favor pida a su farmacia que se ponga en contacto con nuestra oficina. Nuestro nmero de fax es el [phone].  Si tiene un asunto urgente cuando la clnica est cerrada y que no puede esperar hasta el siguiente da hbil, puede llamar/localizar a su doctor(a) al nmero que aparece a continuacin.   Por favor, tenga en cuenta que aunque hacemos todo lo posible para estar disponibles para asuntos urgentes fuera del horario de oficina, no estamos disponibles las 24 horas del da, los 7 das de la semana.   Si tiene un problema urgente y no puede comunicarse con nosotros, puede optar por buscar atencin mdica  en el consultorio de su doctor(a), en una clnica privada, en un centro de atencin urgente o en una sala de emergencias.  Si tiene una emergencia mdica, por favor llame inmediatamente al 911 o vaya a la sala de emergencias.  Nmeros de bper  - Dr. Kowalski: [phone]  - Dra. Moye: [phone]  - Dra. Stewart: [phone]  En caso de inclemencias del tiempo, por favor llame a nuestra lnea principal al [phone] para una actualizacin sobre el estado de cualquier retraso o cierre.  Consejos para la medicacin en dermatologa: Por favor, guarde las cajas en las que vienen los medicamentos de uso tpico para ayudarle a seguir las instrucciones sobre dnde y cmo usarlos. Las farmacias generalmente imprimen las instrucciones del medicamento slo en las cajas y no directamente en los tubos del medicamento.   Si su medicamento es muy caro, por favor, pngase en contacto con nuestra oficina llamando al [phone] y presione la opcin 4 o envenos un mensaje a travs de MyChart.   No podemos decirle cul ser su copago por los medicamentos por adelantado ya que esto es diferente dependiendo de la cobertura de su seguro. Sin embargo, es posible que podamos encontrar un medicamento sustituto a menor costo o llenar un formulario para que el  seguro cubra el medicamento que se considera necesario.   Si se requiere una autorizacin previa para que su compaa de seguros cubra su medicamento, por favor permtanos de 1 a 2 das hbiles para completar este proceso.  Los precios de los medicamentos varan con frecuencia dependiendo del lugar de dnde se surte la receta y alguna farmacias pueden ofrecer precios ms baratos.  El sitio web www.goodrx.com tiene cupones para medicamentos de diferentes farmacias. Los precios aqu no tienen en cuenta lo que podra costar con la ayuda del seguro (puede ser ms barato con su seguro), pero el sitio web puede darle el precio si no utiliz ningn seguro.  - Puede imprimir el cupn correspondiente y llevarlo con su receta a la farmacia.  - Tambin puede pasar por nuestra oficina durante el horario de atencin regular y recoger una tarjeta de cupones de GoodRx.  - Si necesita que su receta se enve electrnicamente a una farmacia diferente, informe a nuestra oficina a travs de MyChart de [place]   o por telfono llamando al [phone] y presione la opcin 4.  

## 2022-02-04 ENCOUNTER — Encounter: Payer: Self-pay | Admitting: Dermatology

## 2023-02-05 ENCOUNTER — Ambulatory Visit: Payer: BC Managed Care – PPO | Admitting: Dermatology

## 2024-01-19 DIAGNOSIS — I10 Essential (primary) hypertension: Secondary | ICD-10-CM | POA: Insufficient documentation

## 2024-02-19 ENCOUNTER — Other Ambulatory Visit (INDEPENDENT_AMBULATORY_CARE_PROVIDER_SITE_OTHER): Payer: Self-pay | Admitting: Vascular Surgery

## 2024-02-19 DIAGNOSIS — I10 Essential (primary) hypertension: Secondary | ICD-10-CM

## 2024-02-23 ENCOUNTER — Ambulatory Visit (INDEPENDENT_AMBULATORY_CARE_PROVIDER_SITE_OTHER): Payer: PRIVATE HEALTH INSURANCE

## 2024-02-23 ENCOUNTER — Ambulatory Visit (INDEPENDENT_AMBULATORY_CARE_PROVIDER_SITE_OTHER): Payer: PRIVATE HEALTH INSURANCE | Admitting: Nurse Practitioner

## 2024-02-23 ENCOUNTER — Encounter (INDEPENDENT_AMBULATORY_CARE_PROVIDER_SITE_OTHER): Payer: Self-pay | Admitting: Nurse Practitioner

## 2024-02-23 VITALS — BP 132/80 | HR 72 | Resp 18 | Ht 70.0 in | Wt 197.0 lb

## 2024-02-23 DIAGNOSIS — I10 Essential (primary) hypertension: Secondary | ICD-10-CM | POA: Diagnosis not present

## 2024-02-23 NOTE — Progress Notes (Signed)
 SUBJECTIVE:  Patient ID: Christian Hodges, male    DOB: [birth year], 63 y.o.   MRN: [id number] Chief Complaint  Patient presents with   New Patient (Initial Visit)    Ref Alla consult essentia hypertension    Discussed the use of AI scribe software for clinical note transcription with the patient, who gave verbal consent to proceed.  History of Present Illness Christian Hodges is a 63 year old male with hypertension who presents for evaluation of difficult-to-control blood pressure. He was referred by Dr. Alla for evaluation of difficult-to-control blood pressure.  He has a long-standing history of hypertension that has been difficult to control for over twenty years. Blood pressure typically ranges around 150/100 mmHg. He has tried various antihypertensive medications over the years, with lisinopril initially working for him. However, he experienced adverse effects such as a persistent cough and vertigo, leading to discontinuation of the medication.  There is a strong family history of hypertension, with his mother also having high blood pressure that was difficult to manage.  No other symptoms related to hypertension, such as headaches or dizziness, except for vertigo episodes related to lisinopril use.     Results RADIOLOGY Renal artery ultrasound: No evidence of stenosis; normal renal size; adequate perfusion to both kidneys.  Past Medical History:  Diagnosis Date   Allergy    Arthritis    hands   Basal cell carcinoma 10/28/2006   R neck   Complication of anesthesia    slow to wake   Dysplastic nevus 04/17/2016   right sup med scapula - mild   Hypertension    Vertigo    new onset within last month(07/21/16)    Past Surgical History:  Procedure Laterality Date   APPENDECTOMY     ELBOW SURGERY Right    HERNIA REPAIR     JOINT REPLACEMENT Bilateral 2008, 2013   hip   NASAL TURBINATE REDUCTION Bilateral 07/25/2016   Procedure: SUBMUCOSAL RESECTION  turbinates;  Surgeon: Chinita Hasten, MD;  Location: [facility];  Service: ENT;  Laterality: Bilateral;   SEPTOPLASTY N/A 07/25/2016   Procedure: SEPTOPLASTY;  Surgeon: Chinita Hasten, MD;  Location: [facility];  Service: ENT;  Laterality: N/A;    Social History   Socioeconomic History   Marital status: Married    Spouse name: Not on file   Number of children: Not on file   Years of education: Not on file   Highest education level: Not on file  Occupational History   Not on file  Tobacco Use   Smoking status: Never   Smokeless tobacco: Never  Substance and Sexual Activity   Alcohol use: No   Drug use: No   Sexual activity: Not on file  Other Topics Concern   Not on file  Social History Narrative   Not on file   Social Drivers of Health   Financial Resource Strain: Low Risk  (01/19/2024)   Received from [facility]   Overall Financial Resource Strain (CARDIA)    Difficulty of Paying Living Expenses: Not hard at all  Food Insecurity: No Food Insecurity (01/19/2024)   Received from [facility]   Hunger Vital Sign    Within the past 12 months, you worried that your food would run out before you got the money to buy more.: Never true    Within the past 12 months, the food you bought just didn't last and you didn't have money to get more.: Never true  Transportation Needs: No Transportation Needs (01/19/2024)   Received from [facility] - Transportation    In the past 12 months, has lack of transportation kept you from medical appointments or from getting medications?: No    Lack of Transportation (Non-Medical): No  Physical Activity: Not on file  Stress: Not on file  Social Connections: Not on file  Intimate Partner Violence: Not on file    Family History  Problem Relation Age of Onset   Hypertension Mother    Lung cancer Mother    Lung cancer Father     Allergies  Allergen Reactions    Amlodipine Swelling   Carvedilol Other (See Comments)    Felt bad   Morphine And Codeine Nausea And Vomiting    Pt reports heart stopped while on morphine pump after hip replacement.     Review of Systems   Review of Systems: Negative Unless Checked Constitutional: [] Weight loss  [] Fever  [] Chills Cardiac: [] Chest pain   []  Atrial Fibrillation  [] Palpitations   [] Shortness of breath when laying flat   [] Shortness of breath with exertion. [] Shortness of breath at rest Vascular:  [] Pain in legs with walking   [] Pain in legs with standing [] Pain in legs when laying flat   [] Claudication    [] Pain in feet when laying flat    [] History of DVT   [] Phlebitis   [] Swelling in legs   [] Varicose veins   [] Non-healing ulcers Pulmonary:   [] Uses home oxygen   [] Productive cough   [] Hemoptysis   [] Wheeze  [] COPD   [] Asthma Neurologic:  [] Dizziness   [] Seizures  [] Blackouts [] History of stroke   [] History of TIA  [] Aphasia   [] Temporary Blindness   [] Weakness or numbness in arm   [] Weakness or numbness in leg Musculoskeletal:   [] Joint swelling   [] Joint pain   [] Low back pain  []  History of Knee Replacement [] Arthritis [] back Surgeries  []  Spinal Stenosis    Hematologic:  [] Easy bruising  [] Easy bleeding   [] Hypercoagulable state   [] Anemic Gastrointestinal:  [] Diarrhea   [] Vomiting  [] Gastroesophageal reflux/heartburn   [] Difficulty swallowing. [] Abdominal pain Genitourinary:  [] Chronic kidney disease   [] Difficult urination  [] Anuric   [] Blood in urine [] Frequent urination  [] Burning with urination   [] Hematuria Skin:  [] Rashes   [] Ulcers [] Wounds Psychological:  [] History of anxiety   []  History of major depression  []  Memory Difficulties      OBJECTIVE:     BP 132/80 (BP Location: Left Arm)   Pulse 72   Resp 18   Ht 5' 10 (1.778 m)   Wt 197 lb (89.4 kg)   BMI 28.27 kg/m   Physical Exam Vitals reviewed.  HENT:     Head: Normocephalic.  Cardiovascular:     Rate and Rhythm: Normal  rate.     Pulses:          Dorsalis pedis pulses are detected w/ Doppler on the right side and detected w/ Doppler on the left side.       Posterior tibial pulses are detected w/ Doppler on the right side and detected w/ Doppler on the left side.  Pulmonary:     Effort: Pulmonary effort is normal.  Skin:    General: Skin is warm and dry.  Neurological:     Mental Status: He is alert and oriented to person, place, and time.  Psychiatric:        Mood and Affect: Mood normal.  Behavior: Behavior normal.        Thought Content: Thought content normal.        Judgment: Judgment normal.      CMP  No results found for: NA, K, CL, CO2, GLUCOSE, BUN, CREATININE, CALCIUM, PROT, ALBUMIN, AST, ALT, ALKPHOS, BILITOT, GFR, EGFR, GFRNONAA  No results found.     ASSESSMENT AND PLAN:  1. Essential hypertension (Primary) Assessment & Plan Renal artery stenosis evaluation for difficult-to-control hypertension No renal artery stenosis. Normal kidney size and blood flow. Blood pressure improved to 130/80 mmHg. Genetic hypertension noted. Lisinopril discontinued due to adverse effects. - No intervention for renal artery stenosis. - Continue follow-up with primary care for blood pressure management.     Current Outpatient Medications on File Prior to Visit  Medication Sig Dispense Refill   Cholecalciferol 1.25 MG (50000 UT) capsule Take 50,000 Units by mouth once a week.     hydrALAZINE (APRESOLINE) 25 MG tablet Take 25 mg by mouth 2 (two) times daily.     hydrochlorothiazide (MICROZIDE) 12.5 MG capsule Take 12.5 mg by mouth daily.     meclizine (ANTIVERT) 25 MG tablet Take 25 mg by mouth 3 (three) times daily as needed.     terazosin (HYTRIN) 10 MG capsule Take 10 mg by mouth at bedtime.     valsartan (DIOVAN) 160 MG tablet Take 160 mg by mouth daily.     CARVEDILOL PO Take 6.25 mg by mouth daily.  (Patient not taking: Reported on 02/23/2024)      loratadine (CLARITIN) 10 MG tablet Take 10 mg by mouth daily. (Patient not taking: Reported on 02/23/2024)     oxyCODONE -acetaminophen  (ROXICET) 5-325 MG tablet Take 1-2 tablets by mouth every 4 (four) hours as needed. (Patient not taking: Reported on 02/23/2024) 40 tablet 0   polyethylene glycol powder (GLYCOLAX /MIRALAX ) powder 255 grams one bottle for colonoscopy prep (Patient not taking: Reported on 02/23/2024) 255 g 0   sulfamethoxazole -trimethoprim  (BACTRIM  DS,SEPTRA  DS) 800-160 MG tablet Take 1 tablet by mouth 2 (two) times daily. (Patient not taking: Reported on 02/23/2024) 30 tablet 0   No current facility-administered medications on file prior to visit.    There are no Patient Instructions on file for this visit. No follow-ups on file.   Naveyah Iacovelli E Merritt Mccravy, NP  This note was completed with Office Manager.  Any errors are purely unintentional.
